# Patient Record
Sex: Male | Born: 1995 | Race: Black or African American | Hispanic: No | Marital: Single | Smoking: Never smoker
Health system: Southern US, Community
[De-identification: ages and names within clinical notes are randomized; demographics above are authoritative.]

---
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
## 2014-11-09 IMAGING — US US ABDOMEN COMPLETE
1 series · 14 of 25 positions shown · non-contrast
Comparison: none

Abdominal ultrasound
HISTORY: Upper abdominal pain

[Series 1: us abdomen complete · 0.28mm/px · 14 of 72 slices shown]
[im 1/72]
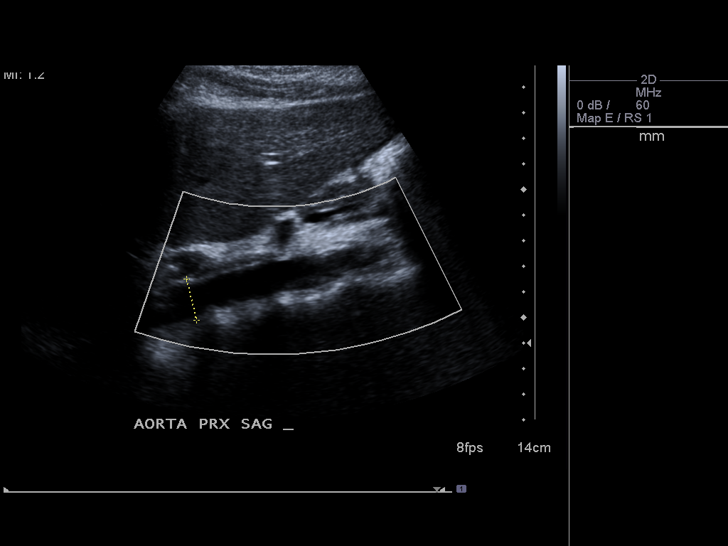
[im 6/72]
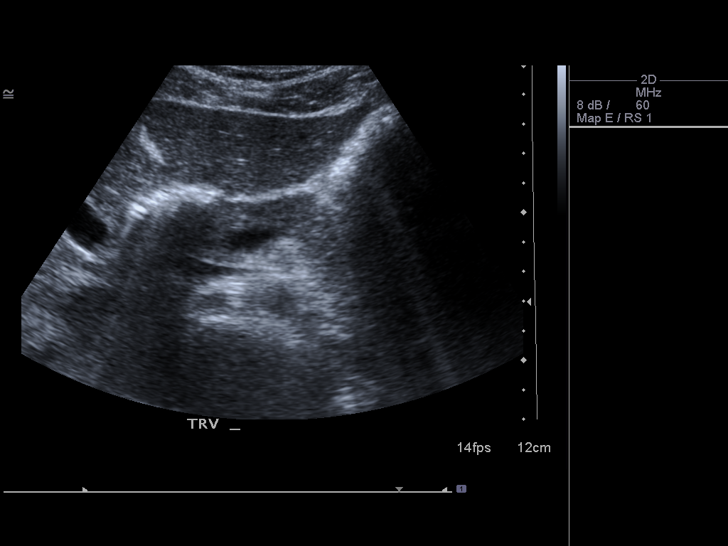
[im 12/72]
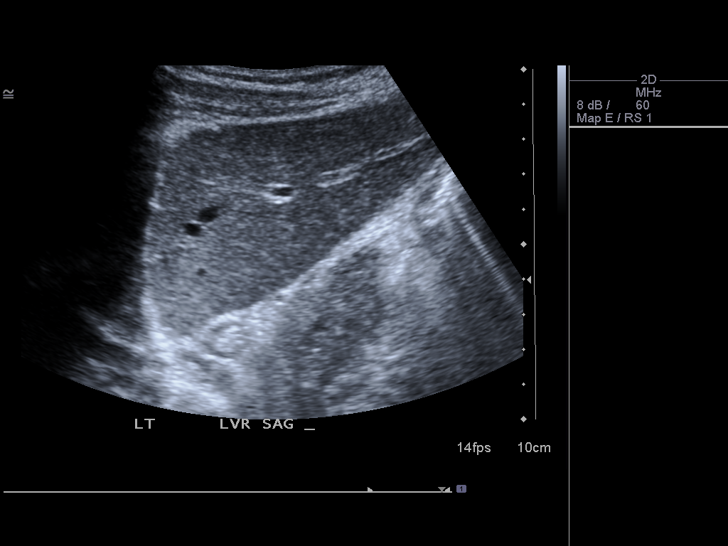
[im 18/72]
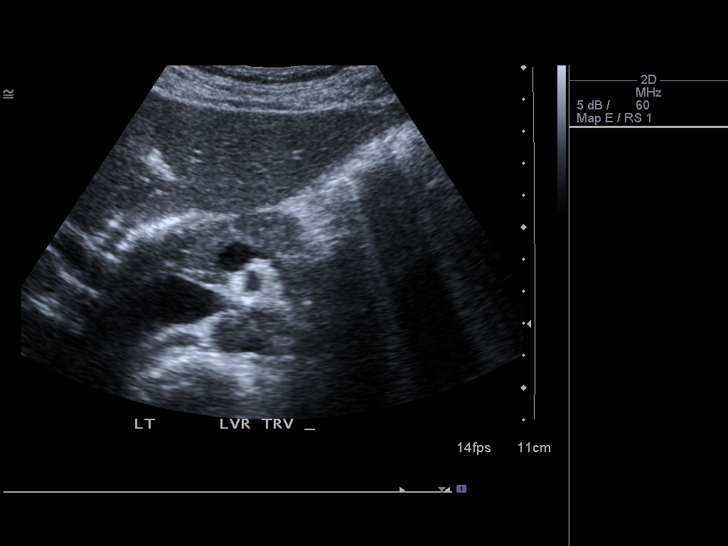
[im 24/72]
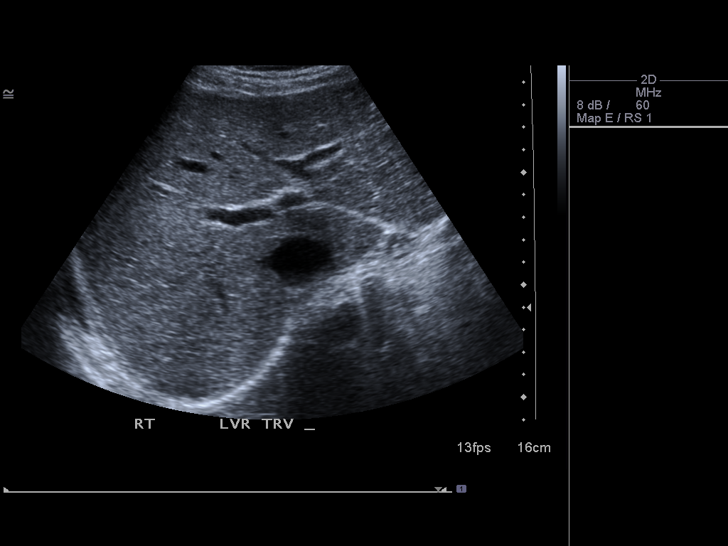
[im 27/72]
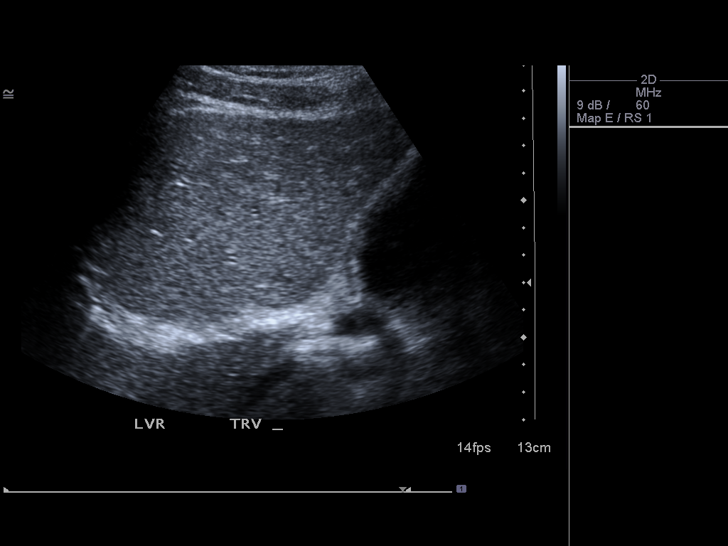
[im 33/72]
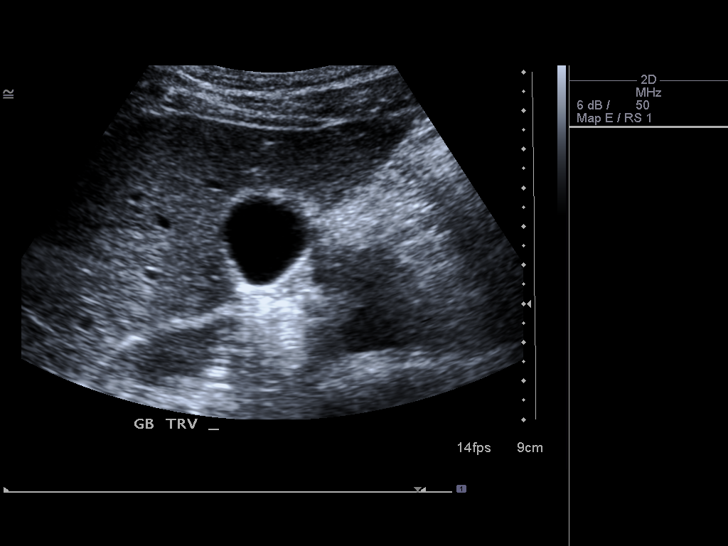
[im 39/72]
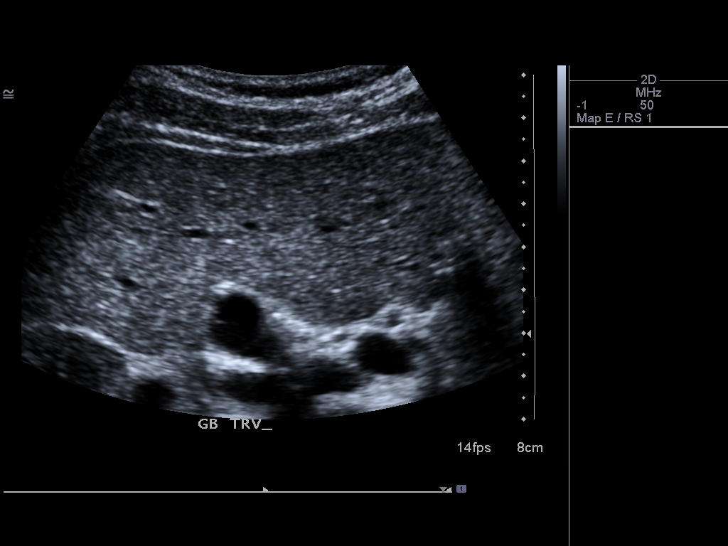
[im 45/72]
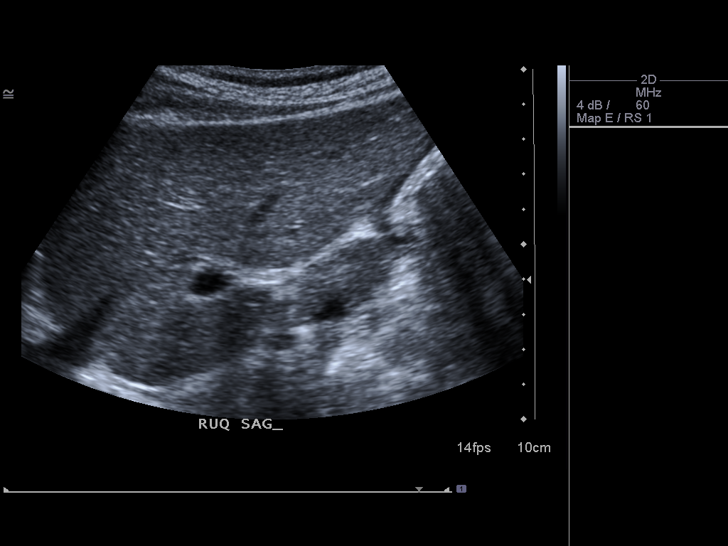
[im 48/72]
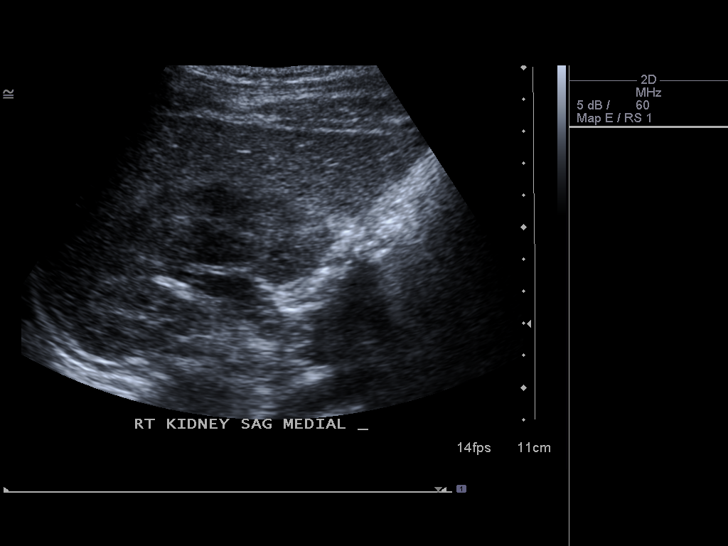
[im 54/72]
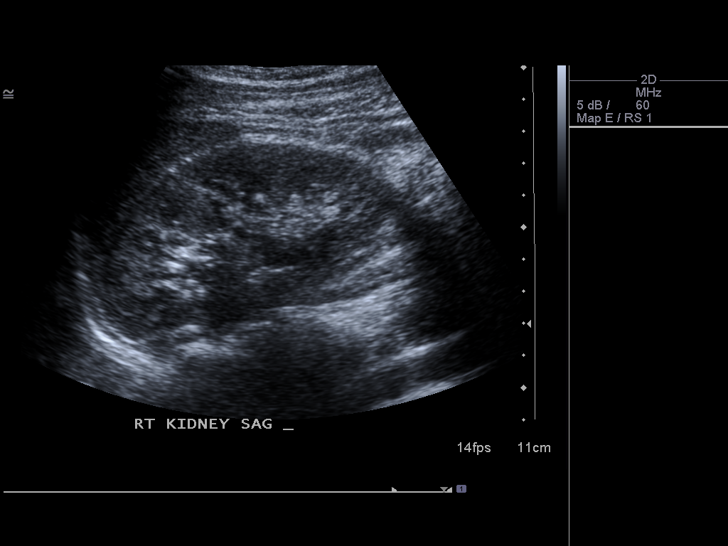
[im 60/72]
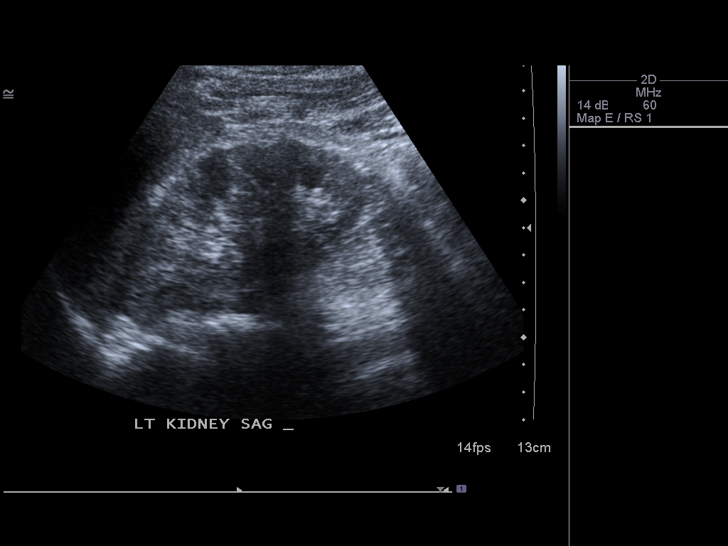
[im 66/72]
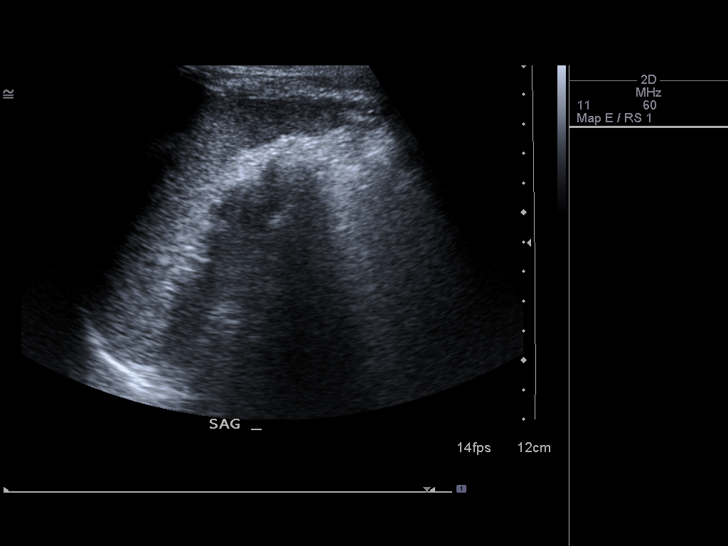
[im 72/72]
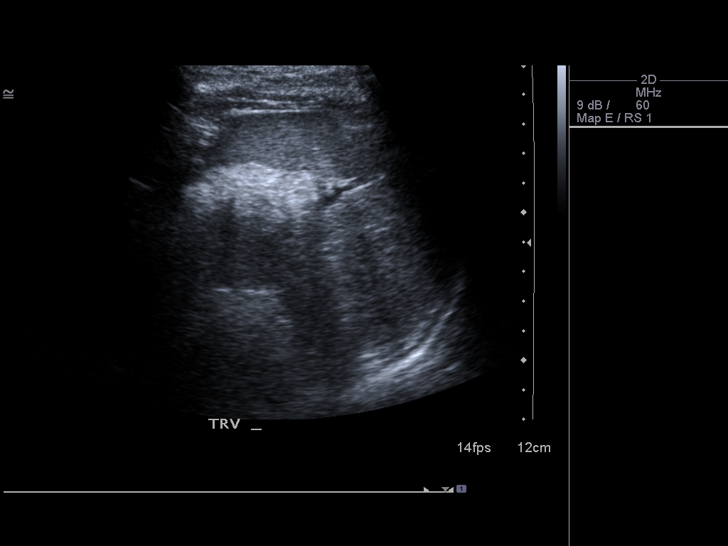

[14 of 25 positions shown; findings below may reference images not displayed]

FINDINGS: The gallbladder is visualized in multiple projections.
There are no gallstones, gallbladder wall thickening, or
pericholecystic fluid collection.  There is no intrahepatic, common
hepatic, or common bile duct dilatation.  Pancreas appears normal.

No focal liver lesions are identified.  Spleen is normal in size
and homogeneous in echotexture. Kidneys bilaterally appear normal.
There is no ascites.  Aorta and inferior vena cava appear within
normal limits.
CONCLUSION: Study within normal limits.

## 2022-05-24 ENCOUNTER — Emergency Department (HOSPITAL_BASED_OUTPATIENT_CLINIC_OR_DEPARTMENT_OTHER): Payer: Self-pay

## 2022-05-24 ENCOUNTER — Other Ambulatory Visit: Payer: Self-pay

## 2022-05-24 ENCOUNTER — Encounter (HOSPITAL_BASED_OUTPATIENT_CLINIC_OR_DEPARTMENT_OTHER): Payer: Self-pay | Admitting: Urology

## 2022-05-24 ENCOUNTER — Emergency Department (HOSPITAL_BASED_OUTPATIENT_CLINIC_OR_DEPARTMENT_OTHER)
Admission: EM | Admit: 2022-05-24 | Discharge: 2022-05-24 | Disposition: A | Discharge status: Home / Self Care | Payer: No Typology Code available for payment source | Attending: Emergency Medicine | Admitting: Emergency Medicine

## 2022-05-24 DIAGNOSIS — R519 Headache, unspecified: Secondary | ICD-10-CM | POA: Diagnosis present

## 2022-05-24 DIAGNOSIS — W208XXA Other cause of strike by thrown, projected or falling object, initial encounter: Secondary | ICD-10-CM | POA: Diagnosis not present

## 2022-05-24 DIAGNOSIS — S0990XA Unspecified injury of head, initial encounter: Secondary | ICD-10-CM

## 2022-05-24 MED ORDER — IBUPROFEN 800 MG PO TABS
800.0000 mg | ORAL_TABLET | Freq: Once | ORAL | Status: AC
  Administered 2022-05-24: 800 mg via ORAL
  Filled 2022-05-24: qty 1

## 2022-05-24 NOTE — Discharge Instructions (Signed)
Your scans today were reassuring and did not show any signs of injury.  You may experience concussion type symptoms, I have provided a handout for these.  Continue to take Tylenol or ibuprofen for headache.  I have provided the contact information for [facility] which is the local concussion clinic in the area if you continue to have concussion symptoms.  Please return to the ER if you have worsening headache, nausea, vomiting, blurry vision, or any other new or concerning symptoms.

## 2022-05-24 NOTE — ED Notes (Signed)
Pt A&OX4 ambulatory at d/c with independent steady gait. Pt verbalized understanding of d/c instructions and follow up care. 

## 2022-05-24 NOTE — ED Triage Notes (Signed)
Pt states a heavy box fell on his head today while working  States slight headache and neck  Pt reports LOC for approx 3 sec when getting hit   NAD at this time

## 2022-05-24 NOTE — ED Provider Notes (Signed)
[facility]GENCY DEPARTMENT Provider Note   CSN: [id number] Arrival date & time: 05/24/22  1506     History  Chief Complaint  Patient presents with   Head Injury    James Richardson is a 27 y.o. male.  HPI 37 male presenting for evaluation from work after having a 40 pound box of frozen meat fall onto his head.  He reports brief loss of consciousness of about 3 seconds.  He endorses a mild headache, no significant blurry vision, nausea or vomiting.  He was sent here for evaluation his employer.  He endorses some mild neck pain but no numbness or tingling in his extremities, no weakness.    Home Medications Prior to Admission medications   Not on File      Allergies    Patient has no known allergies.    Review of Systems   Review of Systems Ten systems reviewed and are negative for acute change, except as noted in the HPI.   Physical Exam Updated Vital Signs BP (!) 108/59 (BP Location: Left Arm)   Pulse (!) 53   Temp 97.7 F (36.5 C)   Resp 18   Ht 5\' 8"  (1.727 m)   Wt 59 kg   SpO2 100%   BMI 19.77 kg/m  Physical Exam Vitals and nursing note reviewed.  Constitutional:      General: He is not in acute distress.    Appearance: He is well-developed.  HENT:     Head: Normocephalic and atraumatic.  Eyes:     Conjunctiva/sclera: Conjunctivae normal.  Cardiovascular:     Rate and Rhythm: Normal rate and regular rhythm.     Heart sounds: No murmur heard. Pulmonary:     Effort: Pulmonary effort is normal. No respiratory distress.     Breath sounds: Normal breath sounds.  Abdominal:     Palpations: Abdomen is soft.     Tenderness: There is no abdominal tenderness.  Musculoskeletal:        General: No swelling.     Cervical back: Neck supple.  Skin:    General: Skin is warm and dry.     Capillary Refill: Capillary refill takes less than 2 seconds.  Neurological:     General: No focal deficit present.     Mental Status: He is alert and  oriented to person, place, and time.  Psychiatric:        Mood and Affect: Mood normal.     ED Results / Procedures / Treatments   Labs (all labs ordered are listed, but only abnormal results are displayed) Labs Reviewed - No data to display  EKG None  Radiology CT Head Wo Contrast  Result Date: 05/24/2022 CLINICAL DATA:  Trauma EXAM: CT HEAD WITHOUT CONTRAST CT CERVICAL SPINE WITHOUT CONTRAST TECHNIQUE: Multidetector CT imaging of the head and cervical spine was performed following the standard protocol without intravenous contrast. Multiplanar CT image reconstructions of the cervical spine were also generated. RADIATION DOSE REDUCTION: This exam was performed according to the departmental dose-optimization program which includes automated exposure control, adjustment of the mA and/or kV according to patient size and/or use of iterative reconstruction technique. COMPARISON:  None Available. FINDINGS: CT HEAD FINDINGS Brain: No evidence of acute infarction, hemorrhage, hydrocephalus, extra-axial collection or mass lesion/mass effect. Vascular: No hyperdense vessel or unexpected calcification. Skull: Normal. Negative for fracture or focal lesion. Sinuses/Orbits: Air-fluid level right maxillary sinus. Other: None. CT CERVICAL SPINE FINDINGS Alignment: There is straightening of the normal cervical  lordosis. Skull base and vertebrae: No acute fracture. No primary bone lesion or focal pathologic process. Soft tissues and spinal canal: No prevertebral fluid or swelling. No visible canal hematoma. Disc levels:  No evidence of high-grade spinal canal stenosis. Upper chest: Negative. Other: None IMPRESSION: 1. No acute intracranial abnormality. 2. No acute cervical spine fracture or traumatic subluxation. 3. Air-fluid level in the right maxillary sinus, correlate for acute sinusitis. Electronically Signed   By: Marin Roberts M.D.   On: 05/24/2022 16:13   CT Cervical Spine Wo Contrast  Result Date:  05/24/2022 CLINICAL DATA:  Trauma EXAM: CT HEAD WITHOUT CONTRAST CT CERVICAL SPINE WITHOUT CONTRAST TECHNIQUE: Multidetector CT imaging of the head and cervical spine was performed following the standard protocol without intravenous contrast. Multiplanar CT image reconstructions of the cervical spine were also generated. RADIATION DOSE REDUCTION: This exam was performed according to the departmental dose-optimization program which includes automated exposure control, adjustment of the mA and/or kV according to patient size and/or use of iterative reconstruction technique. COMPARISON:  None Available. FINDINGS: CT HEAD FINDINGS Brain: No evidence of acute infarction, hemorrhage, hydrocephalus, extra-axial collection or mass lesion/mass effect. Vascular: No hyperdense vessel or unexpected calcification. Skull: Normal. Negative for fracture or focal lesion. Sinuses/Orbits: Air-fluid level right maxillary sinus. Other: None. CT CERVICAL SPINE FINDINGS Alignment: There is straightening of the normal cervical lordosis. Skull base and vertebrae: No acute fracture. No primary bone lesion or focal pathologic process. Soft tissues and spinal canal: No prevertebral fluid or swelling. No visible canal hematoma. Disc levels:  No evidence of high-grade spinal canal stenosis. Upper chest: Negative. Other: None IMPRESSION: 1. No acute intracranial abnormality. 2. No acute cervical spine fracture or traumatic subluxation. 3. Air-fluid level in the right maxillary sinus, correlate for acute sinusitis. Electronically Signed   By: Marin Roberts M.D.   On: 05/24/2022 16:13    Procedures Procedures    Medications Ordered in ED Medications  ibuprofen (ADVIL) tablet 800 mg (has no administration in time range)    ED Course/ Medical Decision Making/ A&P                           Medical Decision Making Amount and/or Complexity of Data Reviewed Radiology: ordered.  Patient presenting after having a box of frozen meat fall  in his head with brief LOC.  Well-appearing on exam.  Imaging ordered in triage, reviewed, agree with radiology read.  CT of the head negative for acute injury, possible right acute sinusitis.  CT of the cervical spine negative for acute fracture.  Patient educated on concussion symptoms.  Will give information for concussion clinic in case he develops symptoms.  Handout provided.  He was given ibuprofen for headache.  We discussed return precautions.  He voiced understanding and is agreeable.  Stable for discharge.    Final Clinical Impression(s) / ED Diagnoses Final diagnoses:  Injury of head, initial encounter    Rx / DC Orders ED Discharge Orders     None         Lyndel Safe 05/24/22 1806    Wyvonnia Dusky, MD 05/24/22 2039
